# Patient Record
Sex: Male | Born: 1955 | Race: White | Hispanic: No | Smoking: Current every day smoker
Health system: Southern US, Community
[De-identification: ages and names within clinical notes are randomized; demographics above are authoritative.]

## PROBLEM LIST (undated history)

## (undated) DIAGNOSIS — G4700 Insomnia, unspecified: Secondary | ICD-10-CM

## (undated) DIAGNOSIS — K219 Gastro-esophageal reflux disease without esophagitis: Secondary | ICD-10-CM

## (undated) DIAGNOSIS — M1990 Unspecified osteoarthritis, unspecified site: Secondary | ICD-10-CM

## (undated) DIAGNOSIS — A4902 Methicillin resistant Staphylococcus aureus infection, unspecified site: Secondary | ICD-10-CM

## (undated) DIAGNOSIS — R350 Frequency of micturition: Secondary | ICD-10-CM

## (undated) DIAGNOSIS — F17200 Nicotine dependence, unspecified, uncomplicated: Secondary | ICD-10-CM

## (undated) DIAGNOSIS — J40 Bronchitis, not specified as acute or chronic: Secondary | ICD-10-CM

## (undated) HISTORY — PX: TONSILLECTOMY: SUR1361

## (undated) HISTORY — PX: COLONOSCOPY W/ POLYPECTOMY: SHX1380

---
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
## 2014-07-30 ENCOUNTER — Other Ambulatory Visit: Payer: Self-pay | Admitting: Neurosurgery

## 2014-07-30 DIAGNOSIS — M5416 Radiculopathy, lumbar region: Secondary | ICD-10-CM

## 2014-08-06 ENCOUNTER — Ambulatory Visit
Admission: RE | Admit: 2014-08-06 | Discharge: 2014-08-06 | Disposition: A | Discharge status: Home / Self Care | Payer: Medicare Other | Source: Ambulatory Visit | Attending: Neurosurgery | Admitting: Neurosurgery

## 2014-08-06 VITALS — BP 139/62 | HR 63 | Ht 71.0 in | Wt 180.0 lb

## 2014-08-06 DIAGNOSIS — M5416 Radiculopathy, lumbar region: Secondary | ICD-10-CM

## 2014-08-06 MED ORDER — MEPERIDINE HCL 100 MG/ML IJ SOLN
75.0000 mg | Freq: Once | INTRAMUSCULAR | Status: AC
  Administered 2014-08-06: 75 mg via INTRAMUSCULAR

## 2014-08-06 MED ORDER — ONDANSETRON HCL 4 MG/2ML IJ SOLN
4.0000 mg | Freq: Once | INTRAMUSCULAR | Status: AC
  Administered 2014-08-06: 4 mg via INTRAMUSCULAR

## 2014-08-06 MED ORDER — DIAZEPAM 5 MG PO TABS
10.0000 mg | ORAL_TABLET | Freq: Once | ORAL | Status: AC
  Administered 2014-08-06: 10 mg via ORAL

## 2014-08-06 MED ORDER — IOHEXOL 180 MG/ML  SOLN
20.0000 mL | Freq: Once | INTRAMUSCULAR | Status: AC | PRN
  Administered 2014-08-06: 20 mL via INTRATHECAL

## 2014-08-06 NOTE — Discharge Instructions (Signed)

## 2014-08-22 ENCOUNTER — Other Ambulatory Visit: Payer: Self-pay | Admitting: Neurosurgery

## 2014-09-05 ENCOUNTER — Encounter (HOSPITAL_COMMUNITY): Payer: Self-pay | Admitting: Pharmacy Technician

## 2014-09-12 ENCOUNTER — Other Ambulatory Visit (HOSPITAL_COMMUNITY): Payer: Medicare Other

## 2014-09-16 ENCOUNTER — Encounter (HOSPITAL_COMMUNITY)
Admission: RE | Admit: 2014-09-16 | Discharge: 2014-09-16 | Disposition: A | Discharge status: Home / Self Care | Payer: Medicare Other | Source: Ambulatory Visit | Attending: Neurosurgery | Admitting: Neurosurgery

## 2014-09-16 ENCOUNTER — Encounter (HOSPITAL_COMMUNITY): Payer: Self-pay

## 2014-09-16 HISTORY — DX: Bronchitis, not specified as acute or chronic: J40

## 2014-09-16 HISTORY — DX: Frequency of micturition: R35.0

## 2014-09-16 HISTORY — DX: Gastro-esophageal reflux disease without esophagitis: K21.9

## 2014-09-16 HISTORY — DX: Nicotine dependence, unspecified, uncomplicated: F17.200

## 2014-09-16 HISTORY — DX: Methicillin resistant Staphylococcus aureus infection, unspecified site: A49.02

## 2014-09-16 HISTORY — DX: Insomnia, unspecified: G47.00

## 2014-09-16 HISTORY — DX: Unspecified osteoarthritis, unspecified site: M19.90

## 2014-09-16 LAB — CBC
HEMATOCRIT: 44.1 % (ref 39.0–52.0)
Hemoglobin: 15.3 g/dL (ref 13.0–17.0)
MCH: 30.8 pg (ref 26.0–34.0)
MCHC: 34.7 g/dL (ref 30.0–36.0)
MCV: 88.9 fL (ref 78.0–100.0)
Platelets: 237 10*3/uL (ref 150–400)
RBC: 4.96 MIL/uL (ref 4.22–5.81)
RDW: 13.4 % (ref 11.5–15.5)
WBC: 9.6 10*3/uL (ref 4.0–10.5)

## 2014-09-16 LAB — BASIC METABOLIC PANEL
Anion gap: 13 (ref 5–15)
BUN: 9 mg/dL (ref 6–23)
CO2: 27 mEq/L (ref 19–32)
CREATININE: 0.84 mg/dL (ref 0.50–1.35)
Calcium: 9.3 mg/dL (ref 8.4–10.5)
Chloride: 99 mEq/L (ref 96–112)
Glucose, Bld: 92 mg/dL (ref 70–99)
Potassium: 4.5 mEq/L (ref 3.7–5.3)
Sodium: 139 mEq/L (ref 137–147)

## 2014-09-16 LAB — SURGICAL PCR SCREEN
MRSA, PCR: NEGATIVE
Staphylococcus aureus: NEGATIVE

## 2014-09-16 LAB — ABO/RH: ABO/RH(D): A POS

## 2014-09-16 LAB — TYPE AND SCREEN
ABO/RH(D): A POS
Antibody Screen: NEGATIVE

## 2014-09-16 NOTE — Progress Notes (Signed)
Patient denied having any cardiac or pulmonary issues. [place] Ex wife at chair side during PAT visit.

## 2014-09-16 NOTE — Pre-Procedure Instructions (Signed)
Nash DimmerLacy Almas  09/16/2014   Your procedure is scheduled on:  Friday September 19, 2014 at 9:00 AM.  [facility] at 6:00 AM.  Call this number if you have problems the morning of surgery: [phone]   Remember:   Do not eat food or drink liquids after midnight.   Take these medicines the morning of surgery with A SIP OF WATER: Alprazolam (Xanax) if needed, Bupropion (Wellbutrin), and Oxycodone (Oxycontin) if needed   Discontinue aspirin and herbal medications 5 days before surgery (Ex. Naproxen (Naprosyn)   Do not wear jewelry.  Do not wear lotions, powders, or cologne.   Men may shave face and neck.  Do not bring valuables to the hospital.  [facility] is not responsible for any belongings or valuables.               Contacts, dentures or bridgework may not be worn into surgery.  Leave suitcase in the car. After surgery it may be brought to your room.  For patients admitted to the hospital, discharge time is determined by your treatment team.               Patients discharged the day of surgery will not be allowed to drive home.  Name and phone number of your driver: Family/Friend  Special Instructions: Shower using CHG soap the night before and the morning of your surgery   Please read over the following fact sheets that you were given: Pain Booklet, Coughing and Deep Breathing, Blood Transfusion Information, MRSA Information and Surgical Site Infection Prevention

## 2014-09-17 NOTE — H&P (Signed)
[name] Graham is an 58 y.o. male.   Chief Complaint: lumbar pain with radiation to legs HPI: patient seen by me 2 months ago with a history of lumbar pain with radiation to both legswhich is getting worse, sometimes being unable to work.  Past Medical History  Diagnosis Date  . Smoker   . Bronchitis     hx of  . Frequency of urination   . Insomnia   . GERD (gastroesophageal reflux disease)   . Arthritis   . MRSA infection     right leg    Past Surgical History  Procedure Laterality Date  . Tonsillectomy    . Colonoscopy w/ polypectomy      No family history on file. Social History:  reports that he has been smoking Cigarettes.  He has a 96 pack-year smoking history. He has never used smokeless tobacco. He reports that he drinks about 3.6 - 4.8 ounces of alcohol per week. He reports that he does not use illicit drugs.  Allergies: No Known Allergies  No prescriptions prior to admission    Results for orders placed during the hospital encounter of 09/16/14 (from the past 48 hour(s))  CBC     Status: None   Collection Time    09/16/14  1:37 PM      Result Value Ref Range   WBC 9.6  4.0 - 10.5 K/uL   RBC 4.96  4.22 - 5.81 MIL/uL   Hemoglobin 15.3  13.0 - 17.0 g/dL   HCT 44.1  39.0 - 52.0 %   MCV 88.9  78.0 - 100.0 fL   MCH 30.8  26.0 - 34.0 pg   MCHC 34.7  30.0 - 36.0 g/dL   RDW 13.4  11.5 - 15.5 %   Platelets 237  150 - 400 K/uL  BASIC METABOLIC PANEL     Status: None   Collection Time    09/16/14  1:37 PM      Result Value Ref Range   Sodium 139  137 - 147 mEq/L   Potassium 4.5  3.7 - 5.3 mEq/L   Chloride 99  96 - 112 mEq/L   CO2 27  19 - 32 mEq/L   Glucose, Bld 92  70 - 99 mg/dL   BUN 9  6 - 23 mg/dL   Creatinine, Ser 0.84  0.50 - 1.35 mg/dL   Calcium 9.3  8.4 - 10.5 mg/dL   GFR calc non Af Amer >90  >90 mL/min   GFR calc Af Amer >90  >90 mL/min   Comment: (NOTE)     The eGFR has been calculated using the CKD EPI equation.     This calculation has not been  validated in all clinical situations.     eGFR's persistently <90 mL/min signify possible Chronic Kidney     Disease.   Anion gap 13  5 - 15  TYPE AND SCREEN     Status: None   Collection Time    09/16/14  1:40 PM      Result Value Ref Range   ABO/RH(D) A POS     Antibody Screen NEG     Sample Expiration 09/30/2014    SURGICAL PCR SCREEN     Status: None   Collection Time    09/16/14  1:40 PM      Result Value Ref Range   MRSA, PCR NEGATIVE  NEGATIVE   Staphylococcus aureus NEGATIVE  NEGATIVE   Comment:  The Xpert SA Assay (FDA     approved for NASAL specimens     in patients over 71 years of age),     is one component of     a comprehensive surveillance     program.  Test performance has     been validated by [employer] for patients greater     than or equal to 48 year old.     It is not intended     to diagnose infection nor to     guide or monitor treatment.  ABO/RH     Status: None   Collection Time    09/16/14  1:40 PM      Result Value Ref Range   ABO/RH(D) A POS     No results found.  Review of Systems  Constitutional: Negative.   Eyes: Negative.   Respiratory: Positive for cough.   Cardiovascular: Negative.   Gastrointestinal: Negative.   Genitourinary: Negative.   Musculoskeletal: Positive for back pain.  Skin: Negative.   Neurological: Positive for headaches.  Endo/Heme/Allergies: Negative.   Psychiatric/Behavioral: The patient is nervous/anxious.     There were no vitals taken for this visit. Physical Exam  Hent, nl. Neck, nl. Cv, nl. Lungs, clear. Abdomen , soft. Extremities, nl. NEURO slr positive at 60 degrees bilaterally. Weakness of DF both feet. Sensory nl. Lumbar myelogram shos stenosis at l4-5 with spondylolisthesis Assessment/Plan Patient to go ahead with decompression and fusion at l4-5. She is aware of risks and benefits  Yahayra Geis M 09/17/2014, 6:17 PM

## 2014-09-18 MED ORDER — CEFAZOLIN SODIUM-DEXTROSE 2-3 GM-% IV SOLR
2.0000 g | INTRAVENOUS | Status: AC
  Administered 2014-09-19: 2 g via INTRAVENOUS
  Filled 2014-09-18: qty 50

## 2014-09-19 ENCOUNTER — Inpatient Hospital Stay (HOSPITAL_COMMUNITY): Payer: Medicare Other

## 2014-09-19 ENCOUNTER — Inpatient Hospital Stay (HOSPITAL_COMMUNITY): Payer: Medicare Other | Admitting: Anesthesiology

## 2014-09-19 ENCOUNTER — Inpatient Hospital Stay (HOSPITAL_COMMUNITY)
Admission: RE | Admit: 2014-09-19 | Discharge: 2014-09-23 | DRG: 460 | Disposition: A | Discharge status: Home / Self Care | Payer: Medicare Other | Source: Ambulatory Visit | Attending: Neurosurgery | Admitting: Neurosurgery

## 2014-09-19 ENCOUNTER — Encounter (HOSPITAL_COMMUNITY): Payer: Self-pay

## 2014-09-19 ENCOUNTER — Encounter (HOSPITAL_COMMUNITY): Payer: Medicare Other | Admitting: Anesthesiology

## 2014-09-19 ENCOUNTER — Encounter (HOSPITAL_COMMUNITY)
Admission: RE | Disposition: A | Discharge status: Home / Self Care | Payer: Medicare Other | Source: Ambulatory Visit | Attending: Neurosurgery

## 2014-09-19 DIAGNOSIS — F17210 Nicotine dependence, cigarettes, uncomplicated: Secondary | ICD-10-CM | POA: Diagnosis present

## 2014-09-19 DIAGNOSIS — M545 Low back pain: Secondary | ICD-10-CM | POA: Diagnosis present

## 2014-09-19 DIAGNOSIS — M4806 Spinal stenosis, lumbar region: Secondary | ICD-10-CM | POA: Diagnosis present

## 2014-09-19 DIAGNOSIS — M4310 Spondylolisthesis, site unspecified: Secondary | ICD-10-CM

## 2014-09-19 DIAGNOSIS — M5116 Intervertebral disc disorders with radiculopathy, lumbar region: Secondary | ICD-10-CM | POA: Diagnosis present

## 2014-09-19 DIAGNOSIS — M4316 Spondylolisthesis, lumbar region: Secondary | ICD-10-CM | POA: Diagnosis present

## 2014-09-19 DIAGNOSIS — K219 Gastro-esophageal reflux disease without esophagitis: Secondary | ICD-10-CM | POA: Diagnosis present

## 2014-09-19 SURGERY — POSTERIOR LUMBAR FUSION 1 LEVEL
Anesthesia: General

## 2014-09-19 MED ORDER — DIAZEPAM 5 MG PO TABS
5.0000 mg | ORAL_TABLET | Freq: Four times a day (QID) | ORAL | Status: DC | PRN
  Administered 2014-09-21 – 2014-09-23 (×6): 5 mg via ORAL
  Filled 2014-09-19 (×6): qty 1

## 2014-09-19 MED ORDER — MIDAZOLAM HCL 2 MG/2ML IJ SOLN
INTRAMUSCULAR | Status: AC
  Filled 2014-09-19: qty 2

## 2014-09-19 MED ORDER — FENTANYL CITRATE 0.05 MG/ML IJ SOLN
INTRAMUSCULAR | Status: AC
  Filled 2014-09-19: qty 5

## 2014-09-19 MED ORDER — FENTANYL 10 MCG/ML IV SOLN
INTRAVENOUS | Status: DC
  Administered 2014-09-19: 130 ug via INTRAVENOUS
  Administered 2014-09-19: 13:00:00 via INTRAVENOUS
  Administered 2014-09-20: 75 ug via INTRAVENOUS
  Administered 2014-09-20: 60 ug via INTRAVENOUS
  Filled 2014-09-19: qty 50

## 2014-09-19 MED ORDER — ONDANSETRON HCL 4 MG/2ML IJ SOLN: 4.0000 mg | Freq: Four times a day (QID) | INTRAMUSCULAR | Status: DC | PRN

## 2014-09-19 MED ORDER — PROPOFOL 10 MG/ML IV BOLUS
INTRAVENOUS | Status: AC
  Filled 2014-09-19: qty 20

## 2014-09-19 MED ORDER — MENTHOL 3 MG MT LOZG: 1.0000 | LOZENGE | OROMUCOSAL | Status: DC | PRN

## 2014-09-19 MED ORDER — LIDOCAINE HCL (CARDIAC) 20 MG/ML IV SOLN
INTRAVENOUS | Status: AC
  Filled 2014-09-19: qty 5

## 2014-09-19 MED ORDER — VANCOMYCIN HCL 1000 MG IV SOLR
INTRAVENOUS | Status: DC | PRN
  Administered 2014-09-19: 1000 mg

## 2014-09-19 MED ORDER — NEOSTIGMINE METHYLSULFATE 10 MG/10ML IV SOLN
INTRAVENOUS | Status: AC
  Filled 2014-09-19: qty 1

## 2014-09-19 MED ORDER — FENTANYL CITRATE 0.05 MG/ML IJ SOLN
INTRAMUSCULAR | Status: DC | PRN
  Administered 2014-09-19 (×2): 100 ug via INTRAVENOUS
  Administered 2014-09-19: 50 ug via INTRAVENOUS
  Administered 2014-09-19 (×2): 100 ug via INTRAVENOUS
  Administered 2014-09-19: 50 ug via INTRAVENOUS
  Administered 2014-09-19: 150 ug via INTRAVENOUS
  Administered 2014-09-19: 100 ug via INTRAVENOUS

## 2014-09-19 MED ORDER — OXYCODONE HCL 5 MG/5ML PO SOLN
5.0000 mg | Freq: Once | ORAL | Status: DC | PRN
Start: 2014-09-19 — End: 2014-09-19

## 2014-09-19 MED ORDER — THROMBIN 20000 UNITS EX SOLR
CUTANEOUS | Status: DC | PRN
  Administered 2014-09-19: 10:00:00 via TOPICAL

## 2014-09-19 MED ORDER — SUCCINYLCHOLINE CHLORIDE 20 MG/ML IJ SOLN
INTRAMUSCULAR | Status: AC
  Filled 2014-09-19: qty 1

## 2014-09-19 MED ORDER — ALBUMIN HUMAN 5 % IV SOLN
INTRAVENOUS | Status: DC | PRN
  Administered 2014-09-19: 11:00:00 via INTRAVENOUS

## 2014-09-19 MED ORDER — NEOSTIGMINE METHYLSULFATE 10 MG/10ML IV SOLN
INTRAVENOUS | Status: DC | PRN
  Administered 2014-09-19: 3 mg via INTRAVENOUS

## 2014-09-19 MED ORDER — HYDROMORPHONE HCL 1 MG/ML IJ SOLN
0.2500 mg | INTRAMUSCULAR | Status: DC | PRN
  Administered 2014-09-19 (×4): 0.5 mg via INTRAVENOUS

## 2014-09-19 MED ORDER — DIPHENHYDRAMINE HCL 50 MG/ML IJ SOLN: 12.5000 mg | Freq: Four times a day (QID) | INTRAMUSCULAR | Status: DC | PRN

## 2014-09-19 MED ORDER — SODIUM CHLORIDE 0.9 % IJ SOLN: 3.0000 mL | INTRAMUSCULAR | Status: DC | PRN

## 2014-09-19 MED ORDER — BUPROPION HCL ER (XL) 150 MG PO TB24
150.0000 mg | ORAL_TABLET | Freq: Every day | ORAL | Status: DC
  Administered 2014-09-19 – 2014-09-23 (×5): 150 mg via ORAL
  Filled 2014-09-19 (×5): qty 1

## 2014-09-19 MED ORDER — PHENYLEPHRINE 40 MCG/ML (10ML) SYRINGE FOR IV PUSH (FOR BLOOD PRESSURE SUPPORT)
PREFILLED_SYRINGE | INTRAVENOUS | Status: AC
  Filled 2014-09-19: qty 10

## 2014-09-19 MED ORDER — ROCURONIUM BROMIDE 50 MG/5ML IV SOLN
INTRAVENOUS | Status: AC
  Filled 2014-09-19: qty 1

## 2014-09-19 MED ORDER — EPHEDRINE SULFATE 50 MG/ML IJ SOLN
INTRAMUSCULAR | Status: AC
  Filled 2014-09-19: qty 1

## 2014-09-19 MED ORDER — SODIUM CHLORIDE 0.9 % IJ SOLN
3.0000 mL | Freq: Two times a day (BID) | INTRAMUSCULAR | Status: DC
  Administered 2014-09-20 – 2014-09-22 (×5): 3 mL via INTRAVENOUS

## 2014-09-19 MED ORDER — CEFAZOLIN SODIUM 1-5 GM-% IV SOLN
1.0000 g | Freq: Three times a day (TID) | INTRAVENOUS | Status: AC
  Administered 2014-09-19 – 2014-09-20 (×2): 1 g via INTRAVENOUS
  Filled 2014-09-19 (×2): qty 50

## 2014-09-19 MED ORDER — ACETAMINOPHEN 650 MG RE SUPP: 650.0000 mg | RECTAL | Status: DC | PRN

## 2014-09-19 MED ORDER — LACTATED RINGERS IV SOLN
INTRAVENOUS | Status: DC | PRN
  Administered 2014-09-19 (×4): via INTRAVENOUS

## 2014-09-19 MED ORDER — HYDROMORPHONE HCL 1 MG/ML IJ SOLN
INTRAMUSCULAR | Status: AC
  Filled 2014-09-19: qty 1

## 2014-09-19 MED ORDER — SODIUM CHLORIDE 0.9 % IV SOLN: 250.0000 mL | INTRAVENOUS | Status: DC

## 2014-09-19 MED ORDER — DEXAMETHASONE SODIUM PHOSPHATE 10 MG/ML IJ SOLN
INTRAMUSCULAR | Status: AC
Start: 2014-09-19 — End: 2014-09-19
  Filled 2014-09-19: qty 1

## 2014-09-19 MED ORDER — DEXAMETHASONE SODIUM PHOSPHATE 10 MG/ML IJ SOLN
INTRAMUSCULAR | Status: DC | PRN
  Administered 2014-09-19: 10 mg via INTRAVENOUS

## 2014-09-19 MED ORDER — PROPOFOL 10 MG/ML IV BOLUS
INTRAVENOUS | Status: DC | PRN
  Administered 2014-09-19: 200 mg via INTRAVENOUS

## 2014-09-19 MED ORDER — GLYCOPYRROLATE 0.2 MG/ML IJ SOLN
INTRAMUSCULAR | Status: DC | PRN
  Administered 2014-09-19: 0.4 mg via INTRAVENOUS

## 2014-09-19 MED ORDER — HYDROMORPHONE HCL 1 MG/ML IJ SOLN
INTRAMUSCULAR | Status: AC
Start: 2014-09-19 — End: 2014-09-19
  Filled 2014-09-19: qty 1

## 2014-09-19 MED ORDER — SODIUM CHLORIDE 0.9 % IJ SOLN
INTRAMUSCULAR | Status: AC
  Filled 2014-09-19: qty 10

## 2014-09-19 MED ORDER — BUPIVACAINE LIPOSOME 1.3 % IJ SUSP
20.0000 mL | Freq: Once | INTRAMUSCULAR | Status: DC
  Filled 2014-09-19: qty 20

## 2014-09-19 MED ORDER — LACTATED RINGERS IV SOLN
INTRAVENOUS | Status: DC
  Administered 2014-09-19: 07:00:00 via INTRAVENOUS

## 2014-09-19 MED ORDER — OXYCODONE-ACETAMINOPHEN 5-325 MG PO TABS
1.0000 | ORAL_TABLET | ORAL | Status: DC | PRN
  Administered 2014-09-20 (×2): 2 via ORAL
  Filled 2014-09-19 (×2): qty 2

## 2014-09-19 MED ORDER — VANCOMYCIN HCL 1000 MG IV SOLR
INTRAVENOUS | Status: AC
  Filled 2014-09-19: qty 1000

## 2014-09-19 MED ORDER — ONDANSETRON HCL 4 MG/2ML IJ SOLN
INTRAMUSCULAR | Status: DC | PRN
Start: 2014-09-19 — End: 2014-09-19
  Administered 2014-09-19: 4 mg via INTRAVENOUS

## 2014-09-19 MED ORDER — 0.9 % SODIUM CHLORIDE (POUR BTL) OPTIME
TOPICAL | Status: DC | PRN
  Administered 2014-09-19: 1000 mL

## 2014-09-19 MED ORDER — GLYCOPYRROLATE 0.2 MG/ML IJ SOLN
INTRAMUSCULAR | Status: AC
  Filled 2014-09-19: qty 2

## 2014-09-19 MED ORDER — BACITRACIN ZINC 500 UNIT/GM EX OINT
TOPICAL_OINTMENT | CUTANEOUS | Status: DC | PRN
  Administered 2014-09-19: 1 via TOPICAL

## 2014-09-19 MED ORDER — ZOLPIDEM TARTRATE 5 MG PO TABS
10.0000 mg | ORAL_TABLET | Freq: Every evening | ORAL | Status: DC | PRN
  Administered 2014-09-20 – 2014-09-22 (×3): 10 mg via ORAL
  Filled 2014-09-19 (×3): qty 2

## 2014-09-19 MED ORDER — MIDAZOLAM HCL 5 MG/5ML IJ SOLN
INTRAMUSCULAR | Status: DC | PRN
  Administered 2014-09-19: 2 mg via INTRAVENOUS

## 2014-09-19 MED ORDER — NICOTINE 14 MG/24HR TD PT24
14.0000 mg | MEDICATED_PATCH | Freq: Every day | TRANSDERMAL | Status: DC
  Administered 2014-09-20 – 2014-09-23 (×4): 14 mg via TRANSDERMAL
  Filled 2014-09-19 (×4): qty 1

## 2014-09-19 MED ORDER — ONDANSETRON HCL 4 MG/2ML IJ SOLN: 4.0000 mg | INTRAMUSCULAR | Status: DC | PRN

## 2014-09-19 MED ORDER — NALOXONE HCL 0.4 MG/ML IJ SOLN: 0.4000 mg | INTRAMUSCULAR | Status: DC | PRN

## 2014-09-19 MED ORDER — PROMETHAZINE HCL 25 MG/ML IJ SOLN: 6.2500 mg | INTRAMUSCULAR | Status: DC | PRN

## 2014-09-19 MED ORDER — LIDOCAINE HCL (CARDIAC) 20 MG/ML IV SOLN
INTRAVENOUS | Status: DC | PRN
  Administered 2014-09-19: 100 mg via INTRAVENOUS

## 2014-09-19 MED ORDER — BUPIVACAINE LIPOSOME 1.3 % IJ SUSP
INTRAMUSCULAR | Status: DC | PRN
  Administered 2014-09-19: 20 mL

## 2014-09-19 MED ORDER — ONDANSETRON HCL 4 MG/2ML IJ SOLN
INTRAMUSCULAR | Status: AC
  Filled 2014-09-19: qty 2

## 2014-09-19 MED ORDER — ROCURONIUM BROMIDE 100 MG/10ML IV SOLN
INTRAVENOUS | Status: DC | PRN
  Administered 2014-09-19: 10 mg via INTRAVENOUS
  Administered 2014-09-19: 50 mg via INTRAVENOUS
  Administered 2014-09-19: 20 mg via INTRAVENOUS
  Administered 2014-09-19: 10 mg via INTRAVENOUS

## 2014-09-19 MED ORDER — ACETAMINOPHEN 325 MG PO TABS: 650.0000 mg | ORAL_TABLET | ORAL | Status: DC | PRN

## 2014-09-19 MED ORDER — PHENOL 1.4 % MT LIQD: 1.0000 | OROMUCOSAL | Status: DC | PRN

## 2014-09-19 MED ORDER — SODIUM CHLORIDE 0.9 % IV SOLN
INTRAVENOUS | Status: DC
  Administered 2014-09-19 – 2014-09-20 (×2): via INTRAVENOUS

## 2014-09-19 MED ORDER — DIPHENHYDRAMINE HCL 12.5 MG/5ML PO ELIX: 12.5000 mg | ORAL_SOLUTION | Freq: Four times a day (QID) | ORAL | Status: DC | PRN

## 2014-09-19 MED ORDER — SODIUM CHLORIDE 0.9 % IJ SOLN: 9.0000 mL | INTRAMUSCULAR | Status: DC | PRN

## 2014-09-19 MED ORDER — OXYCODONE HCL 5 MG PO TABS: 5.0000 mg | ORAL_TABLET | Freq: Once | ORAL | Status: DC | PRN

## 2014-09-19 SURGICAL SUPPLY — 68 items
BENZOIN TINCTURE PRP APPL 2/3 (GAUZE/BANDAGES/DRESSINGS) ×3 IMPLANT
BLADE CLIPPER SURG (BLADE) IMPLANT
BUR ACORN 6.0 (BURR) ×4 IMPLANT
BUR ACORN 6.0MM (BURR) ×2
BUR MATCHSTICK NEURO 3.0 LAGG (BURR) ×3 IMPLANT
CANISTER SUCT 3000ML (MISCELLANEOUS) ×3 IMPLANT
CAP LOCKING THREADED (Cap) ×12 IMPLANT
CLOSURE WOUND 1/2 X4 (GAUZE/BANDAGES/DRESSINGS) ×1
CONT SPEC 4OZ CLIKSEAL STRL BL (MISCELLANEOUS) ×6 IMPLANT
COVER BACK TABLE 60X90IN (DRAPES) ×3 IMPLANT
CROSSLINK SPINAL 38-50MM (Neuro Prosthesis/Implant) ×3 IMPLANT
DRAPE C-ARM 42X72 X-RAY (DRAPES) ×6 IMPLANT
DRAPE LAPAROTOMY 100X72X124 (DRAPES) ×3 IMPLANT
DRAPE POUCH INSTRU U-SHP 10X18 (DRAPES) ×3 IMPLANT
DRSG OPSITE POSTOP 4X6 (GAUZE/BANDAGES/DRESSINGS) ×3 IMPLANT
DRSG PAD ABDOMINAL 8X10 ST (GAUZE/BANDAGES/DRESSINGS) IMPLANT
DURAPREP 26ML APPLICATOR (WOUND CARE) ×3 IMPLANT
DURASEAL APPLICATOR TIP (TIP) ×3 IMPLANT
DURASEAL SPINE SEALANT 3ML (MISCELLANEOUS) ×3 IMPLANT
ELECT BLADE 4.0 EZ CLEAN MEGAD (MISCELLANEOUS) ×3
ELECT REM PT RETURN 9FT ADLT (ELECTROSURGICAL) ×3
ELECTRODE BLDE 4.0 EZ CLN MEGD (MISCELLANEOUS) ×1 IMPLANT
ELECTRODE REM PT RTRN 9FT ADLT (ELECTROSURGICAL) ×1 IMPLANT
EVACUATOR 1/8 PVC DRAIN (DRAIN) ×3 IMPLANT
GAUZE SPONGE 4X4 12PLY STRL (GAUZE/BANDAGES/DRESSINGS) ×3 IMPLANT
GAUZE SPONGE 4X4 16PLY XRAY LF (GAUZE/BANDAGES/DRESSINGS) ×3 IMPLANT
GLOVE BIOGEL M 8.0 STRL (GLOVE) ×6 IMPLANT
GLOVE EXAM NITRILE LRG STRL (GLOVE) IMPLANT
GLOVE EXAM NITRILE MD LF STRL (GLOVE) IMPLANT
GLOVE EXAM NITRILE XL STR (GLOVE) IMPLANT
GLOVE EXAM NITRILE XS STR PU (GLOVE) IMPLANT
GOWN STRL REUS W/ TWL LRG LVL3 (GOWN DISPOSABLE) ×2 IMPLANT
GOWN STRL REUS W/ TWL XL LVL3 (GOWN DISPOSABLE) ×1 IMPLANT
GOWN STRL REUS W/TWL 2XL LVL3 (GOWN DISPOSABLE) IMPLANT
GOWN STRL REUS W/TWL LRG LVL3 (GOWN DISPOSABLE) ×4
GOWN STRL REUS W/TWL XL LVL3 (GOWN DISPOSABLE) ×2
KIT BASIN OR (CUSTOM PROCEDURE TRAY) ×3 IMPLANT
KIT INFUSE MEDIUM (Orthopedic Implant) ×3 IMPLANT
KIT ROOM TURNOVER OR (KITS) ×3 IMPLANT
MILL MEDIUM DISP (BLADE) ×3 IMPLANT
NEEDLE HYPO 18GX1.5 BLUNT FILL (NEEDLE) IMPLANT
NEEDLE HYPO 21X1.5 SAFETY (NEEDLE) ×3 IMPLANT
NEEDLE HYPO 25X1 1.5 SAFETY (NEEDLE) IMPLANT
NS IRRIG 1000ML POUR BTL (IV SOLUTION) ×3 IMPLANT
PACK LAMINECTOMY NEURO (CUSTOM PROCEDURE TRAY) ×3 IMPLANT
PAD ARMBOARD 7.5X6 YLW CONV (MISCELLANEOUS) ×9 IMPLANT
PATTIES SURGICAL .5 X1 (DISPOSABLE) ×3 IMPLANT
PATTIES SURGICAL .5 X3 (DISPOSABLE) IMPLANT
ROD CREO 45MM SPINAL (Rod) ×6 IMPLANT
SCREW CREO THREADED 5.5X45MM (Screw) ×12 IMPLANT
SPACER RISE 8X22 11-17MM-15 (Spacer) ×6 IMPLANT
SPONGE LAP 4X18 X RAY DECT (DISPOSABLE) IMPLANT
SPONGE NEURO XRAY DETECT 1X3 (DISPOSABLE) IMPLANT
SPONGE SURGIFOAM ABS GEL 100 (HEMOSTASIS) ×3 IMPLANT
STAPLER VISISTAT 35W (STAPLE) ×3 IMPLANT
STRIP CLOSURE SKIN 1/2X4 (GAUZE/BANDAGES/DRESSINGS) ×2 IMPLANT
SUT PROLENE 5 0 C1 (SUTURE) ×3 IMPLANT
SUT VIC AB 1 CT1 18XBRD ANBCTR (SUTURE) ×2 IMPLANT
SUT VIC AB 1 CT1 8-18 (SUTURE) ×4
SUT VIC AB 2-0 CP2 18 (SUTURE) ×3 IMPLANT
SUT VIC AB 3-0 SH 8-18 (SUTURE) ×6 IMPLANT
SYR 20CC LL (SYRINGE) ×3 IMPLANT
SYR 20ML ECCENTRIC (SYRINGE) ×3 IMPLANT
SYR 5ML LL (SYRINGE) IMPLANT
TOWEL OR 17X24 6PK STRL BLUE (TOWEL DISPOSABLE) ×3 IMPLANT
TOWEL OR 17X26 10 PK STRL BLUE (TOWEL DISPOSABLE) ×3 IMPLANT
TRAY FOLEY CATH 14FRSI W/METER (CATHETERS) ×3 IMPLANT
WATER STERILE IRR 1000ML POUR (IV SOLUTION) ×3 IMPLANT

## 2014-09-19 NOTE — Anesthesia Postprocedure Evaluation (Signed)
Anesthesia Post Note  Patient: [name] Graham  Procedure(s) Performed: Procedure(s) (LRB): POSTERIOR LUMBAR INTERBODY FUSION LUMBAR FOUR-FIVE (N/A)  Anesthesia type: general  Patient location: PACU  Post pain: Pain level controlled  Post assessment: Patient's Cardiovascular Status Stable  Last Vitals:  Filed Vitals:   09/19/14 1323  BP:   Pulse: 90  Temp:   Resp: 11    Post vital signs: Reviewed and stable  Level of consciousness: sedated  Complications: No apparent anesthesia complications

## 2014-09-19 NOTE — Anesthesia Preprocedure Evaluation (Signed)
Anesthesia Evaluation    Reviewed: Allergy & Precautions, H&P , NPO status   History of Anesthesia Complications Negative for: history of anesthetic complications  Airway        Dental   Pulmonary Current Smoker,          Cardiovascular negative cardio ROS      Neuro/Psych negative psych ROS   GI/Hepatic Neg liver ROS, GERD-  ,  Endo/Other    Renal/GU      Musculoskeletal   Abdominal   Peds  Hematology   Anesthesia Other Findings   Reproductive/Obstetrics                             Anesthesia Physical Anesthesia Plan  ASA: II  Anesthesia Plan: General   Post-op Pain Management:    Induction: Intravenous  Airway Management Planned: Oral ETT  Additional Equipment:   Intra-op Plan:   Post-operative Plan: Extubation in OR  Informed Consent:   Plan Discussed with: CRNA, Anesthesiologist and Surgeon  Anesthesia Plan Comments:         Anesthesia Quick Evaluation

## 2014-09-19 NOTE — Op Note (Signed)
[name] Graham:  Graham, [name]                   ACCOUNT NO.:  [id number]  MEDICAL RECORD NO.:  [id number]  LOCATION:  [id number]                        FACILITY:  [facility]  PHYSICIAN:  [name], M.D.   DATE OF BIRTH:  04-21-1956  DATE OF PROCEDURE:  09/19/2014 DATE OF DISCHARGE:                              OPERATIVE REPORT   PREOPERATIVE DIAGNOSIS:  L4-L5 stenosis with spondylolisthesis.  Chronic lumbar radiculopathy.  POSTOPERATIVE DIAGNOSIS:  L4-L5 stenosis with spondylolisthesis. Chronic lumbar radiculopathy.  PROCEDURE:  L4 Gill procedure which involved removal of spinous process, lamina, and facet.  Bilaterally L4-5 diskectomy beyond the normal to be able to introduce 2 cages, right and left.  Pedicle screws at L4, L5, posterolateral arthrodesis with autograft and BMP.  SURGEON:  Hilda LiasErnesto Zavier Canela, M.D.  ASSISTING:  Donalee CitrinGary Cram, M.D.  CLINICAL HISTORY:  The patient was seen in my office on several occasions, complaining of back pain radiating to both legs.  X-ray showed multiple level degenerative disk disease, but at the level of L4- 5, he has a stenosis secondary to spondylolisthesis.  Surgery was advised, and the patient knew the risk with the surgery including possibility of infection, hematoma, seroma, need of further surgery, CSF leak.  DESCRIPTION OF PROCEDURE:  The patient was taken to the OR, and after intubation, he was positioned in a prone manner.  The back was cleaned with Betadine and later on with DuraPrep.  Drapes were applied.  Midline incision from upper L4 down to the level of L5 was made, all the way down through the fascia.  Muscle and fascia were retracted all the way laterally.  The x-ray showed indeed we were right at the level of 4-5. From then on, we started the [facility] of removing the spinous process of L4, the lamina.  The patient has quite a bit of hypertrophy of the facet.  With a drill, we were able to remove L4 facet.  The patient has quite a bit of  thickening of the yellow ligament with accession bilaterally, worse on the right side.  Microdissection was carried out with decompression of the thecal sac as well as the L4 and L5 nerve root was done.  There was a small adhesion at the level of L4 in the right side with arachnoid pouch.  It was oversewn with a 5-0 Prolene.  From then on, we entered the disk space first in the right side and then in the left side.  Incision was made.  Total gross diskectomy was achieved medially and laterally beyond the normal.  The endplates were drilled.  Then, a cage from 83 mm height was expanded all the way down to 17.  Both cages had autograft and BMP.  Lateral lumbar spine showed good position of the cages.  Then using the C-arm first in AP view and then a lateral view, we were able to feel the pedicle of L4 and L5.  Holes were made.  Prior to introduction of the screws, we were able to probe the all 4 holes just to be sure that we were surrounded by bone.  From then on, 4 screws of 5.5 x 45 mm were inserted, kept in  place with a rod and capps.  From then on, we went laterally, the L4-5, the lateral aspect of the facet and transverse process were removed and a mix of BMP and autograft was used for arthrodesis.  The area was irrigated.  Valsalva maneuver up to 50 twice was negative.  From then on, the area was irrigated.  Drain was left in the epidural space, and the wound was closed with Vicryl and staples.          ______________________________ [name], M.D.     EB/MEDQ  D:  09/19/2014  T:  09/19/2014  Job:  [id number]

## 2014-09-19 NOTE — Progress Notes (Signed)
Utilization review completed.  

## 2014-09-19 NOTE — Progress Notes (Signed)
Patient ID: [name] Graham, male   DOB: 06/16/1956, 58 y.o.   MRN: [id number] Feeling better. No leg pain as preop. Wants nicoderm patch. hemovac working

## 2014-09-19 NOTE — Transfer of Care (Signed)
Immediate Anesthesia Transfer of Care Note  Patient: [name] Graham  Procedure(s) Performed: Procedure(s): POSTERIOR LUMBAR INTERBODY FUSION LUMBAR FOUR-FIVE (N/A)  Patient Location: PACU  Anesthesia Type:General  Level of Consciousness: awake, alert  and patient cooperative  Airway & Oxygen Therapy: Patient Spontanous Breathing and Patient connected to nasal cannula oxygen  Post-op Assessment: Report given to PACU RN, Post -op Vital signs reviewed and stable and Patient moving all extremities  Post vital signs: Reviewed and stable  Complications: No apparent anesthesia complications

## 2014-09-20 ENCOUNTER — Encounter (HOSPITAL_COMMUNITY): Payer: Self-pay

## 2014-09-20 MED ORDER — MORPHINE SULFATE 4 MG/ML IJ SOLN
4.0000 mg | INTRAMUSCULAR | Status: DC | PRN
  Administered 2014-09-20 – 2014-09-21 (×3): 4 mg via INTRAMUSCULAR
  Filled 2014-09-20 (×4): qty 1

## 2014-09-20 MED ORDER — OXYCODONE-ACETAMINOPHEN 5-325 MG PO TABS
1.0000 | ORAL_TABLET | ORAL | Status: DC | PRN
  Administered 2014-09-20 – 2014-09-23 (×15): 2 via ORAL
  Filled 2014-09-20 (×16): qty 2

## 2014-09-20 NOTE — Progress Notes (Signed)
PCA fentanyl 220 mcg wasted, witnessed by Jaci CarrelEunice Ansomaa RN.

## 2014-09-20 NOTE — Progress Notes (Signed)
Called ortho tec to notify that patient was ordered a brace.

## 2014-09-20 NOTE — Progress Notes (Signed)
Occupational Therapy Evaluation and Discharge Patient Details Name: [name] Graham MRN: [id number] DOB: 07/15/1956 Today's Date: 09/20/2014    History of Present Illness Patient is a 58 y/o male s/p PLIF L4-5.   Clinical Impression   PTA pt lived at home and was independent with ADLs and IADLs. Pt currently requires Supervision for ADLs and functional mobility. Pt progressing well with mobility and will have 24/7 assistance initially. No further acute OT needs.     Follow Up Recommendations  No OT follow up    Equipment Recommendations  None recommended by OT    Recommendations for Other Services       Precautions / Restrictions Precautions Precautions: Back Precaution Booklet Issued: Yes (comment) Precaution Comments: Educated on 3/3 back precautions and incorporating into ADLs. Pt able to recall 3/3 precautions. Required Braces or Orthoses: Spinal Brace Spinal Brace: Lumbar corset;Applied in sitting position Restrictions Weight Bearing Restrictions: No      Mobility Bed Mobility Overal bed mobility: Needs Assistance Bed Mobility: Rolling;Sidelying to Sit;Sit to Sidelying Rolling: Supervision Sidelying to sit: Min guard;HOB elevated     Sit to sidelying: Min guard;HOB elevated General bed mobility comments: Pt sitting in recliner when OT arrived. Pt verbalized log roll technique and per PT note, required Min Guard/Supervision for bed mobility.   Transfers Overall transfer level: Needs assistance Equipment used: None Transfers: Sit to/from Stand Sit to Stand: Supervision         General transfer comment: Supervision for safety.    Balance Overall balance assessment: Needs assistance   Sitting balance-Leahy Scale: Good     Standing balance support: During functional activity Standing balance-Leahy Scale: Fair                              ADL Overall ADL's : Needs assistance/impaired                                        General ADL Comments: Pt overall requires Supervision for ADLs and functional mobility. Pt ambulating well and eager to get home. Educated pt on incorporating back precautions into ADLs including keeping items on counter level, using a cup for brushing teeth, tongs for toilet hygiene, etc.      Vision  Pt wears glasses at all times and reports no change from baseline.   No apparent deficits.                  Perception Perception Perception Tested?: No   Praxis Praxis Praxis tested?: Within functional limits    Pertinent Vitals/Pain Pain Assessment: 0-10 Pain Score: 5  Pain Location: back Pain Descriptors / Indicators: Sore Pain Intervention(s): Monitored during session;Repositioned     Hand Dominance     Extremity/Trunk Assessment Upper Extremity Assessment Upper Extremity Assessment: Overall WFL for tasks assessed   Lower Extremity Assessment Lower Extremity Assessment: Overall WFL for tasks assessed   Cervical / Trunk Assessment Cervical / Trunk Assessment: Normal   Communication Communication Communication: No difficulties   Cognition Arousal/Alertness: Awake/alert Behavior During Therapy: WFL for tasks assessed/performed Overall Cognitive Status: Within Functional Limits for tasks assessed                                Home Living Family/patient expects to be discharged to:: Private residence Living  Arrangements: Spouse/significant other Available Help at Discharge: Family;Available 24 hours/day (Ex-wife to stay with pt for a short time) Type of Home: House Home Access: Stairs to enter [employer] CorporationEntrance Stairs-Number of Steps: 2 Entrance Stairs-Rails: Right;Left;Can reach both Home Layout: One level     Bathroom Shower/Tub: Tub/shower unit Shower/tub characteristics: Engineer, building servicesCurtain Bathroom Toilet: Standard     Home Equipment: Shower seat          Prior Functioning/Environment Level of Independence: Independent        Comments: very active  PTA, works as Education administratorpainter. Drives.    OT Diagnosis: Generalized weakness;Acute pain                          End of Session Equipment Utilized During Treatment: Gait belt;Back brace  Activity Tolerance: Patient tolerated treatment well Patient left: in chair;with call bell/phone within reach   Time: 1559-1610 OT Time Calculation (min): 11 min Charges:  OT General Charges $OT Visit: 1 Procedure OT Evaluation $Initial OT Evaluation Tier I: 1 Procedure  Nena JordanMiller, Gerline Ratto M 09/20/2014, 4:11 PM  Carney LivingLeeAnn Marie Johnhenry Tippin, OTR/L Occupational Therapist [phone] (pager)

## 2014-09-20 NOTE — Progress Notes (Signed)
Orthopedic Tech Progress Note Patient Details:  Gean QuintLacy J Taubman 07/03/1956 [id number] Called in order to [employer]. Patient ID: Gean QuintLacy J Mayfield, male   DOB: 08/30/1956, 58 y.o.   MRN: [id number]   Lesle ChrisGilliland, Jemaine Prokop L 09/20/2014, 12:41 PM

## 2014-09-20 NOTE — Progress Notes (Signed)
Patient pulled hemo vac on accident. Will continue to monitor.

## 2014-09-20 NOTE — Progress Notes (Signed)
Filed Vitals:   09/20/14 0200 09/20/14 0400 09/20/14 0600 09/20/14 0828  BP: 104/52  117/55   Pulse: 66  67   Temp: 98.6 F (37 C)  97.5 F (36.4 C)   TempSrc: Oral  Oral   Resp: 18 21 20 22   Height:      Weight:      SpO2: 99% 99% 98% 97%    Patient resting in bed, fairly comfortable. Has not yet been out of bed. Has a fentanyl PCA, but not using it much. We'll DC fentanyl PCA and order Percocet by mouth or morphine IM for pain. This will allow patient to mobilize. Foley to see this morning, and patient has voided. Dressing with a small amount of bloody drainage.  Plan: Encouraged to ambulate at least 3-4 times today in the hall, and 4-5 times in the halls tomorrow. Awaiting PT and OT.  Hewitt ShortsNUDELMAN,ROBERT W, MD 09/20/2014, 9:45 AM

## 2014-09-20 NOTE — Evaluation (Signed)
Physical Therapy Evaluation Patient Details Name: [name] Graham MRN: [id number] DOB: 11/09/1956 Today's Date: 09/20/2014   History of Present Illness  Patient is a 58 y/o male s/p PLIF L4-5.  Clinical Impression  Patient presents with functional limitations due to deficits listed in PT problem list (see below). Pt with pain and mild balance deficits impacting safe mobility. Anticipate balance and mobility will improve with continued ambulation. Recommend daily ambulation in hall with family/RN. Education provided on back precautions. Able to recall 3/3 precautions within session. Pt would benefit from acute PT to improve transfers, balance and overall safe mobility so pt can maximize independence and return to PLOF.   Follow Up Recommendations Supervision - Intermittent;No PT follow up    Equipment Recommendations  None recommended by PT    Recommendations for Other Services       Precautions / Restrictions Precautions Precautions: Back Precaution Comments: Educated on back precautions. Required Braces or Orthoses: Spinal Brace Spinal Brace: Lumbar corset;Applied in sitting position Restrictions Weight Bearing Restrictions: No      Mobility  Bed Mobility Overal bed mobility: Needs Assistance Bed Mobility: Rolling;Sidelying to Sit;Sit to Sidelying Rolling: Supervision Sidelying to sit: Min guard;HOB elevated     Sit to sidelying: Min guard;HOB elevated General bed mobility comments: Use of rails for support. VC's for log roll technique.  Transfers Overall transfer level: Needs assistance Equipment used: None Transfers: Sit to/from Stand Sit to Stand: Supervision         General transfer comment: Supervision for safety.  Ambulation/Gait Ambulation/Gait assistance: Min guard Ambulation Distance (Feet): 200 Feet Assistive device: None Gait Pattern/deviations: Step-through pattern;Decreased stride length;Drifts right/left   Gait velocity interpretation: Below normal  speed for age/gender General Gait Details: Pt with mild unsteady gait with drifting noted occasionally especially with head turns but no overt LOB.  Stairs Stairs: Yes Stairs assistance: Min guard Stair Management: Step to pattern;One rail Right Number of Stairs: 2 General stair comments: Min guard for safety. Cues for technique.  Wheelchair Mobility    Modified Rankin (Stroke Patients Only)       Balance Overall balance assessment: Needs assistance   Sitting balance-Leahy Scale: Good     Standing balance support: During functional activity Standing balance-Leahy Scale: Fair                               Pertinent Vitals/Pain Pain Assessment: 0-10 Pain Score: 5  Pain Location: back Pain Descriptors / Indicators: Sore;Aching Pain Intervention(s): Monitored during session;Repositioned    Home Living Family/patient expects to be discharged to:: Private residence Living Arrangements: Spouse/significant other Available Help at Discharge: Family;Available 24 hours/day (Ex-wife to stay with patient for a short while.) Type of Home: House Home Access: Stairs to enter Entrance Stairs-Rails: Right;Left;Can reach both Entrance Stairs-Number of Steps: 2 Home Layout: One level Home Equipment: None      Prior Function Level of Independence: Independent         Comments: very active PTA, works as Education administratorpainter. Drives.     Hand Dominance        Extremity/Trunk Assessment   Upper Extremity Assessment: Defer to OT evaluation           Lower Extremity Assessment: Overall WFL for tasks assessed         Communication   Communication: No difficulties  Cognition Arousal/Alertness: Awake/alert Behavior During Therapy: WFL for tasks assessed/performed Overall Cognitive Status: Within Functional Limits for tasks assessed  General Comments General comments (skin integrity, edema, etc.): Instructed pt on when/how to donn lumbar  corset.    Exercises        Assessment/Plan    PT Assessment Patient needs continued PT services  PT Diagnosis Acute pain;Difficulty walking   PT Problem List Pain;Decreased activity tolerance;Decreased safety awareness;Decreased balance;Decreased knowledge of precautions;Decreased mobility  PT Treatment Interventions Balance training;Gait training;Stair training;Patient/family education;Functional mobility training;Therapeutic activities;Therapeutic exercise   PT Goals (Current goals can be found in the Care Plan section) Acute Rehab PT Goals Patient Stated Goal: to get home PT Goal Formulation: With patient Time For Goal Achievement: 10/04/14 Potential to Achieve Goals: Good    Frequency Min 5X/week   Barriers to discharge   Pt has ex wife staying with him at d/c.    Co-evaluation               End of Session Equipment Utilized During Treatment: Gait belt Activity Tolerance: Patient tolerated treatment well;Patient limited by pain Patient left: in chair;with call bell/phone within reach Nurse Communication: Mobility status;Precautions         Time: 7425-95631530-1545 PT Time Calculation (min): 15 min   Charges:   PT Evaluation $Initial PT Evaluation Tier I: 1 Procedure PT Treatments $Gait Training: 8-22 mins   PT G CodesAlvie Heidelberg:          Folan, Avarose Mervine A 09/20/2014, 3:55 PM Alvie HeidelbergShauna Folan, PT, DPT [phone]

## 2014-09-21 MED ORDER — MORPHINE SULFATE 4 MG/ML IJ SOLN
4.0000 mg | INTRAMUSCULAR | Status: DC | PRN
Start: 2014-09-21 — End: 2014-09-23
  Administered 2014-09-21 – 2014-09-22 (×5): 4 mg via INTRAVENOUS
  Filled 2014-09-21 (×5): qty 1

## 2014-09-21 MED ORDER — GABAPENTIN 300 MG PO CAPS
300.0000 mg | ORAL_CAPSULE | Freq: Two times a day (BID) | ORAL | Status: DC
  Administered 2014-09-21 – 2014-09-23 (×5): 300 mg via ORAL
  Filled 2014-09-21 (×5): qty 1

## 2014-09-21 MED ORDER — POLYETHYLENE GLYCOL 3350 17 G PO PACK
17.0000 g | PACK | Freq: Every day | ORAL | Status: DC
  Administered 2014-09-21 – 2014-09-23 (×3): 17 g via ORAL
  Filled 2014-09-21 (×3): qty 1

## 2014-09-21 MED ORDER — BISACODYL 10 MG RE SUPP
10.0000 mg | Freq: Once | RECTAL | Status: AC
  Administered 2014-09-21: 10 mg via RECTAL
  Filled 2014-09-21: qty 1

## 2014-09-21 NOTE — Progress Notes (Signed)
Physical Therapy Treatment Patient Details Name: Gean QuintLacy J Pulver MRN: [id number] DOB: 11/25/1955 Today's Date: 09/21/2014    History of Present Illness      PT Comments    Pt very focused on abdominal pain and lack of BM since surgery during PT session today. Pt progressing well with mobility.  Follow Up Recommendations  Supervision - Intermittent;No PT follow up     Equipment Recommendations  None recommended by PT    Recommendations for Other Services       Precautions / Restrictions Precautions Precautions: Back Precaution Comments: Pt independently recalled 3/3 back precautions. Required Braces or Orthoses: Spinal Brace Spinal Brace: Lumbar corset;Applied in sitting position    Mobility  Bed Mobility     Rolling: Modified independent (Device/Increase time) Sidelying to sit: Supervision;HOB elevated       General bed mobility comments: supervision for technique/precautions  Transfers   Equipment used: Rolling walker (2 wheeled)   Sit to Stand: Supervision         General transfer comment: Supervision for safety.  Ambulation/Gait Ambulation/Gait assistance: Min guard Ambulation Distance (Feet): 200 Feet Assistive device: Rolling walker (2 wheeled) Gait Pattern/deviations: Step-through pattern;Decreased stride length   Gait velocity interpretation: Below normal speed for age/gender General Gait Details: Pt has been ambulating without A.D. but requested use of RW this session due to pain.   Stairs            Wheelchair Mobility    Modified Rankin (Stroke Patients Only)       Balance   Sitting-balance support: No upper extremity supported;Feet supported Sitting balance-Leahy Scale: Good     Standing balance support: During functional activity Standing balance-Leahy Scale: Fair                      Cognition Arousal/Alertness: Awake/alert Behavior During Therapy: WFL for tasks assessed/performed Overall Cognitive Status: Within  Functional Limits for tasks assessed                      Exercises      General Comments        Pertinent Vitals/Pain Pain Assessment: 0-10 Pain Score: 9  Pain Location: back/abdomen Pain Intervention(s): Monitored during session;Limited activity within patient's tolerance;Repositioned    Home Living                      Prior Function            PT Goals (current goals can now be found in the care plan section) Progress towards PT goals: Progressing toward goals    Frequency  Min 5X/week    PT Plan Current plan remains appropriate    Co-evaluation             End of Session Equipment Utilized During Treatment: Gait belt Activity Tolerance: Patient limited by pain Patient left: in chair;with call bell/phone within reach     Time: [id number] PT Time Calculation (min): 11 min  Charges:  $Gait Training: 8-22 mins                    G Codes:      Ilda FoilGarrow, Alexandro Line Rene 09/21/2014, 10:06 AM

## 2014-09-21 NOTE — Progress Notes (Signed)
Patient ID: [name] Graham J Sedberry, male   DOB: 11/25/1955, 58 y.o.   MRN: [id number] Patient will do okay is having some posterior thigh and calf pain  Strength out of 5 wound clean dry and intact  Mobilized withphysical occupational therapy aggressive bowel regimen

## 2014-09-22 MED ORDER — BISACODYL 10 MG RE SUPP
10.0000 mg | Freq: Every day | RECTAL | Status: DC | PRN
  Administered 2014-09-22: 10 mg via RECTAL
  Filled 2014-09-22: qty 1

## 2014-09-22 MED FILL — Heparin Sodium (Porcine) Inj 1000 Unit/ML: INTRAMUSCULAR | Qty: 30 | Status: AC

## 2014-09-22 MED FILL — Sodium Chloride IV Soln 0.9%: INTRAVENOUS | Qty: 1000 | Status: AC

## 2014-09-22 NOTE — Progress Notes (Signed)
Patient ID: [name] Graham, male   DOB: 09/25/1956, 58 y.o.   MRN: [id number] Ambulating with help. C/o incisional pain  Wound dry. bm positive

## 2014-09-22 NOTE — Progress Notes (Signed)
Physical Therapy Treatment Patient Details Name: [name] Graham MRN: [id number] DOB: 06/12/1956 Today's Date: 09/22/2014    History of Present Illness Patient is a 58 y/o male s/p PLIF L4-5.    PT Comments    Patient continues to make good progress. Patient safe to D/C from a mobility standpoint based on progression towards goals set on PT eval.    Follow Up Recommendations  Supervision - Intermittent;No PT follow up     Equipment Recommendations  None recommended by PT    Recommendations for Other Services       Precautions / Restrictions Precautions Precautions: Back Precaution Comments: Pt independently recalled 3/3 back precautions. Required Braces or Orthoses: Spinal Brace Spinal Brace: Lumbar corset;Applied in sitting position    Mobility  Bed Mobility Overal bed mobility: Modified Independent                Transfers Overall transfer level: Needs assistance Equipment used: Rolling walker (2 wheeled) Transfers: Sit to/from Stand Sit to Stand: Supervision         General transfer comment: Cues for hand placement and not to pull up from RW  Ambulation/Gait Ambulation/Gait assistance: Supervision Ambulation Distance (Feet): 600 Feet Assistive device: Rolling walker (2 wheeled) Gait Pattern/deviations: Step-through pattern;Decreased stride length   Gait velocity interpretation: Below normal speed for age/gender General Gait Details: Used RW for increased ambulation and pain    Stairs Stairs: Yes Stairs assistance: Supervision Stair Management: Alternating pattern;Forwards;Two rails Number of Stairs: 5 General stair comments: Supervision for safety  Wheelchair Mobility    Modified Rankin (Stroke Patients Only)       Balance                                    Cognition Arousal/Alertness: Awake/alert Behavior During Therapy: WFL for tasks assessed/performed Overall Cognitive Status: Within Functional Limits for tasks  assessed                      Exercises      General Comments        Pertinent Vitals/Pain Pain Score: 5  Pain Location: back Pain Descriptors / Indicators: Sore Pain Intervention(s): Monitored during session;Premedicated before session    Home Living                      Prior Function            PT Goals (current goals can now be found in the care plan section) Progress towards PT goals: Progressing toward goals    Frequency  Min 5X/week    PT Plan Current plan remains appropriate    Co-evaluation             End of Session Equipment Utilized During Treatment: Gait belt Activity Tolerance: Patient tolerated treatment well Patient left: in chair;with call bell/phone within reach     Time: 0910-0924 PT Time Calculation (min): 14 min  Charges:  $Gait Training: 8-22 mins                    G Codes:      Fredrich BirksRobinette, Lennis Rader Elizabeth 09/22/2014, 9:42 AM  09/22/2014 Fredrich Birksobinette, Kentley Blyden Elizabeth PTA [phone] pager [phone] office

## 2014-09-23 NOTE — Progress Notes (Signed)
Physical Therapy Treatment Patient Details Name: [name] Graham MRN: [id number] DOB: 1956/01/03 Today's Date: 09/23/2014    History of Present Illness Patient is a 58 y/o male s/p PLIF L4-5.    PT Comments    Patient progressing well and has meet all PT goals. Will sign off on acute PT at this time. Recommend daily ambulation.   Follow Up Recommendations  Supervision - Intermittent;No PT follow up     Equipment Recommendations  None recommended by PT    Recommendations for Other Services       Precautions / Restrictions Precautions Precautions: Back Precaution Comments: Pt independently recalled 3/3 back precautions. Required Braces or Orthoses: Spinal Brace Spinal Brace: Lumbar corset;Applied in sitting position Restrictions Weight Bearing Restrictions: No    Mobility  Bed Mobility                  Transfers Overall transfer level: Modified independent                  Ambulation/Gait Ambulation/Gait assistance: Modified independent (Device/Increase time) Ambulation Distance (Feet): 1200 Feet Assistive device: Rolling walker (2 wheeled) Gait Pattern/deviations: Step-through pattern;Decreased stride length         Stairs   Stairs assistance: Modified independent (Device/Increase time) Stair Management: Two rails Number of Stairs: 5 General stair comments: practiced twice  Wheelchair Mobility    Modified Rankin (Stroke Patients Only)       Balance                                    Cognition Arousal/Alertness: Awake/alert Behavior During Therapy: WFL for tasks assessed/performed Overall Cognitive Status: Within Functional Limits for tasks assessed                      Exercises      General Comments        Pertinent Vitals/Pain Pain Assessment: No/denies pain    Home Living                      Prior Function            PT Goals (current goals can now be found in the care plan section)  Progress towards PT goals: Goals met/education completed, patient discharged from PT    Frequency       PT Plan      Co-evaluation             End of Session Equipment Utilized During Treatment: Back brace Activity Tolerance: Patient tolerated treatment well Patient left: in chair;with call bell/phone within reach     Time: 0903-0916 PT Time Calculation (min): 13 min  Charges:  $Gait Training: 8-22 mins                    G Codes:      Jacqualyn Posey 09/23/2014, 9:51 AM  09/23/2014 Jacqualyn Posey PTA [phone] pager [phone] office

## 2014-09-23 NOTE — Discharge Instructions (Signed)
Spinal Fusion °Care After  °These instructions give you information on caring for yourself after your procedure. Your doctor may also give you more specific instructions. Call your doctor if you have any problems or questions after your procedure. °HOME CARE °· Only take medicines as told by your doctor. °· Do not drive if you are taking certain pain medicines (narcotics). °· Change your bandage (dressing) as told by your doctor. °· Do not get the wound wet. Do not take baths or swim until your doctor says it is okay. °· Check the wound often for redness, puffiness (swelling), or leaking fluids. °· Ask your doctor what activities you should avoid and for how long. °· Walk as much as you can.  Do not sit for long periods of time. Change positions every hour. °· Do not lift anything heavier than 5 to 10 pounds (2.25 to 4.5 kilograms) until your doctor says it is safe. °· Do not twist or bend for 6 weeks. Try not to pull on things. Do not hold things away from your body. °· Ask your doctor what exercises you should do. Exercises can help make the back stronger. °GET HELP RIGHT AWAY IF: °· Your pain suddenly gets worse. °· The wound is red, puffy, bleeding, or leaking fluid. °· Your legs or feet are painful, puffy, weak, or lose feeling (numb). °· You cannot control when you pee (urinate) or poop (bowel movement). °· You have trouble breathing. °· You have chest pain. °· You have a temperature by mouth above 102° F (38.9° C), not controlled by medicine. °MAKE SURE YOU: °· Understand these instructions. °· Will watch your condition. °· Will get help right away if you are not doing well or get worse. °Document Released: 03/03/2011 Document Revised: 01/30/2012 Document Reviewed: 03/03/2011 °[employer]® Patient Information ©2015 [employer]. This information is not intended to replace advice given to you by your health care provider. Make sure you discuss any questions you have with your health care provider. °Spinal  Fusion °Spinal fusion is a procedure to make 2 or more of the bones in your spinal column (vertebrae) grow together (fuse). This procedure stops movement between the vertebrae and can relieve pain and prevent deformity.  °Spinal fusion is used to treat the following conditions: °· Fractures of the spine. °· Herniated disk (the spongy material [cartilage] between the vertebrae). °· Abnormal curvatures of the spine, such as scoliosis or kyphosis. °· A weak or an unstable spine, caused by infections or tumor. °RISKS AND COMPLICATIONS °Complications associated with spinal fusion are rare, but they can occur. Possible complications include: °· Bleeding. °· Infection near the incision. °· Nerve damage. Signs of nerve damage are back pain, pain in one or both legs, weakness, or numbness. °· Spinal fluid leakage. °· Blood clot in your leg, which can move to your lungs. °· Difficulty controlling urination or bowel movements. °BEFORE THE PROCEDURE °· A medical evaluation will be done. This will include a physical exam, blood tests, and imaging exams. °· You will talk with an anesthesiologist. This is the person who will be in charge of the anesthesia during the procedure. Spinal fusion usually requires that you are asleep during the procedure (general anesthesia). °· You will need to stop taking certain medicines, particularly those associated with an increased risk of bleeding. Ask your caregiver about changing or stopping your regular medicines. °· If you smoke, you will need to stop at least 2 weeks before the procedure. Smoking can slow down the healing process,   especially fusion of the vertebrae, and increase the risk of complications. °· Do not eat or drink anything for at least 8 hours before the procedure. °PROCEDURE  °A cut (incision) is made over the vertebrae that will be fused. The back muscles are separated from the vertebrae. If you are having this procedure to treat a herniated disk, the disc material pressing  on the nerve root is removed (decompression). The area where the disk is removed is then filled with extra bone. Bone from another part of your body (autogenous bone) or bone from a bone donor (allograft bone) may be used. The extra bone promotes fusion between the vertebrae. Sometimes, specific medicines are added to the fusion area to promote bone healing. In most cases, screws and rods or metal plates will be used to attach the vertebrae to stabilize them while they fuse.  °AFTER THE PROCEDURE  °· You will stay in a recovery area until the anesthesia has worn off. Your blood pressure and pulse will be checked frequently. °· You will be given antibiotics to prevent infection. °· You may continue to receive fluids through an intravenous (IV) tube while you are still in the hospital. °· Pain after surgery is normal. You will be given pain medicine. °· You will be taught how to move correctly and how to stand and walk. While in bed, you will be instructed to turn frequently, using a "log rolling" technique, in which the entire body is moved without twisting the back. °Document Released: 08/06/2003 Document Revised: 01/30/2012 Document Reviewed: 01/20/2011 °[employer]® Patient Information ©2015 [employer]. This information is not intended to replace advice given to you by your health care provider. Make sure you discuss any questions you have with your health care provider. ° °

## 2014-09-23 NOTE — Progress Notes (Signed)
Discharge orders received, pt for discharge home today, IV D/C with dressing CDI to lower back.  D/C instructions and Rx given with verbalized understanding.  Family at bedside to assist pt with discharge. Staff brought pt downstairs via wheelchair.  

## 2014-09-23 NOTE — Plan of Care (Signed)
Problem: Acute Rehab PT Goals(only PT should resolve) Goal: Pt will Roll Supine to Side Outcome: Completed/Met Date Met:  09/23/14 Goal: Pt Will Go Supine/Side To Sit Outcome: Completed/Met Date Met:  09/23/14 Goal: Pt Will Go Sit To Supine/Side Outcome: Completed/Met Date Met:  09/23/14 Goal: Patient Will Transfer Sit To/From Stand Outcome: Completed/Met Date Met:  09/23/14 Goal: Pt Will Ambulate Outcome: Completed/Met Date Met:  09/23/14 Goal: Pt Will Go Up/Down Stairs Outcome: Completed/Met Date Met:  09/23/14 Goal: Pt Will Verbalize and Adhere to Precautions While PT Will Verbalize and Adhere to Precautions While Performing Mobility  Outcome: Completed/Met Date Met:  09/23/14

## 2014-09-23 NOTE — Discharge Summary (Signed)
Physician Discharge Summary  Patient ID: [name] Graham J Bhullar MRN: [id number] DOB/AGE: 60/11/1955 60 y.o.  Admit date: 09/19/2014 Discharge date: 09/23/2014  Admission Diagnoses:lumbar spondylolisthesis  Discharge Diagnoses:  Active Problems:   Spondylolisthesis of lumbar region   Discharged Condition: ambulating  Hospital Course: surgery  Consults: none  Significant Diagnostic Studies:myelogram  Treatments: lumbar fusion  Discharge Exam: Blood pressure 113/65, pulse 69, temperature 97.8 F (36.6 C), temperature source Oral, resp. rate 19, height 5\' 11"  (1.803 m), weight 78.472 kg (173 lb), SpO2 98 %. No weakness. ambulating  Disposition: home     Medication List    ASK your doctor about these medications        ALPRAZolam 1 MG tablet  Commonly known as:  XANAX  Take 0.5-1 mg by mouth 2 (two) times daily as needed for anxiety or sleep.     buPROPion 150 MG 24 hr tablet  Commonly known as:  WELLBUTRIN XL  Take 150 mg by mouth daily.     naproxen 500 MG tablet  Commonly known as:  NAPROSYN  Take 500 mg by mouth 2 (two) times daily with a meal.     OXYCONTIN 30 MG T12a  Generic drug:  OxyCODONE HCl ER  Take 30 mg by mouth 4 (four) times daily as needed (for pain).     triamcinolone ointment 0.1 %  Commonly known as:  KENALOG  Apply 1 application topically once as needed.         Signed: Karn CassisBOTERO,Curtis Cain M 09/23/2014, 1:36 PM

## 2016-04-25 IMAGING — CT CT L SPINE W/ CM
4 of 12 series · 11 of 33 positions shown, 13 images · non-contrast
Comparison: None

CLINICAL DATA: Back pain

EXAM:
CT MYELOGRAPHY LUMBAR SPINE
TECHNIQUE: CT imaging of the lumbar spine was performed after intrathecal
contrast administration. Multiplanar CT image reconstructions were
also generated.

[Series 2: l spine bone · axial · 0.27mm/px · z∈[-363,-110]mm · 3 of 102 slices shown, 4 images]
[im 1/102  soft-tissue]
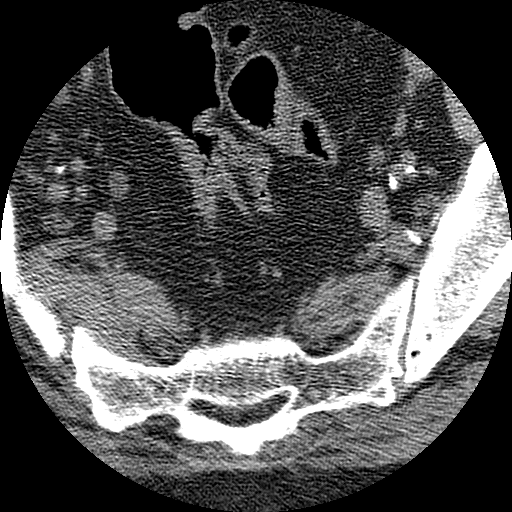
[im 1/102  bone]
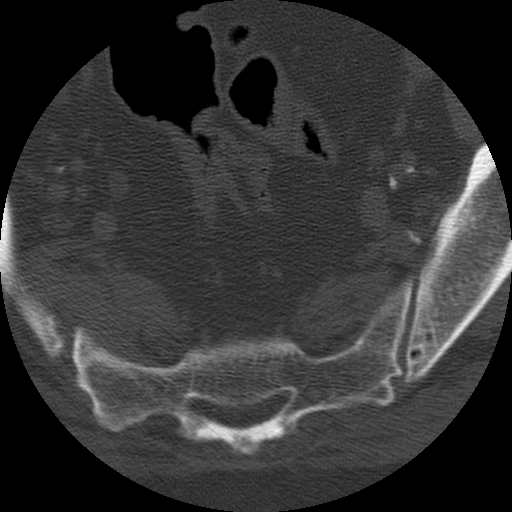
[im 51/102  bone]
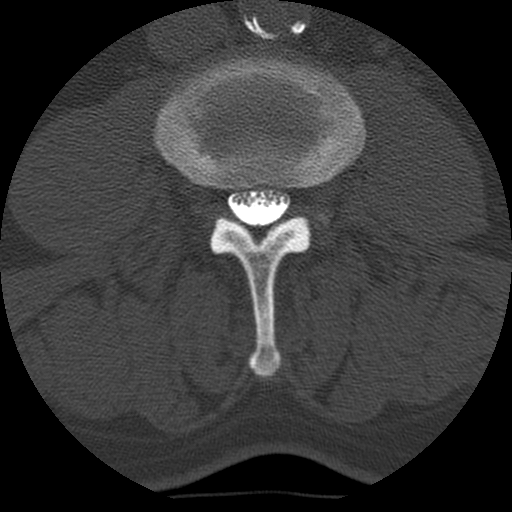
[im 102/102  bone]
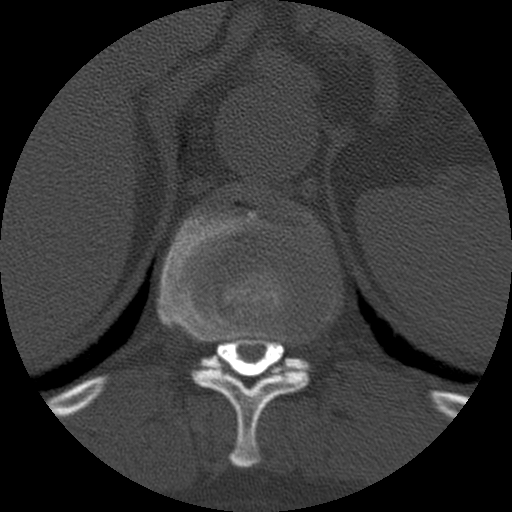

[Series 3: l spine soft · axial · 0.27mm/px · z∈[-280,-195]mm · 2 of 101 slices shown]
[im 34/101  soft-tissue]
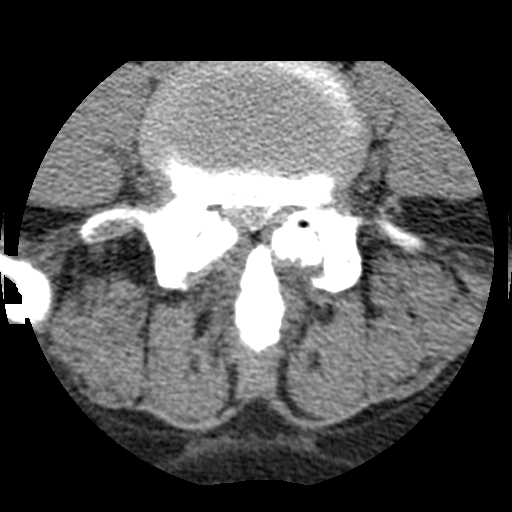
[im 67/101  soft-tissue]
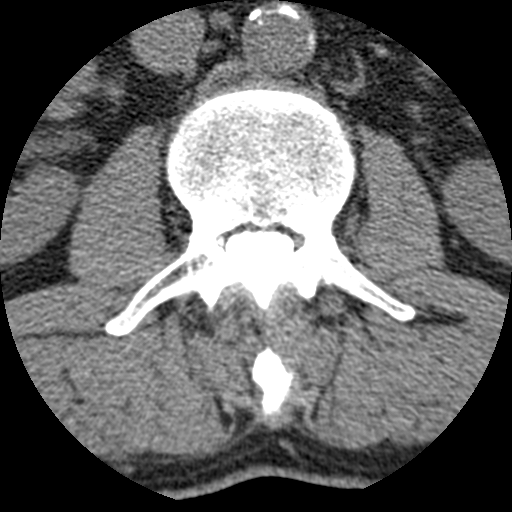

[Series 401: cor · coronal · 0.51mm/px · 1 of 65 slices shown]
[im 50/65  bone]
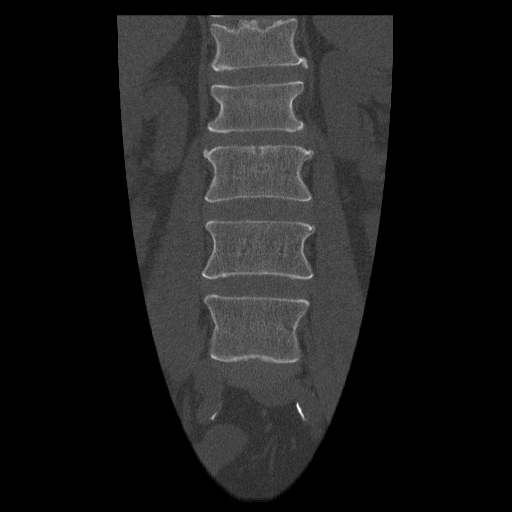

[Series 403: sag · sagittal · 0.51mm/px · 5 of 68 slices shown, 6 images]
[im 23/68  bone]
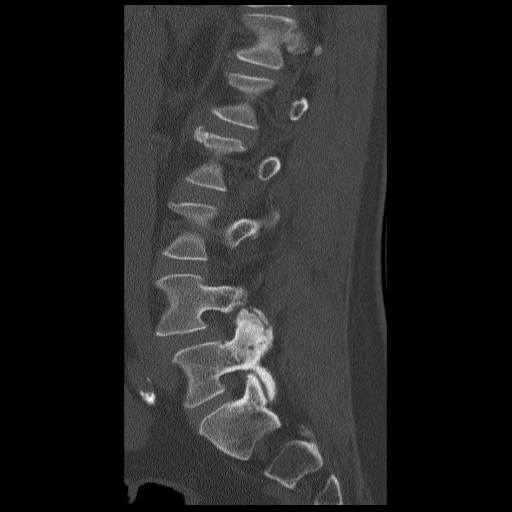
[im 28/68  bone]
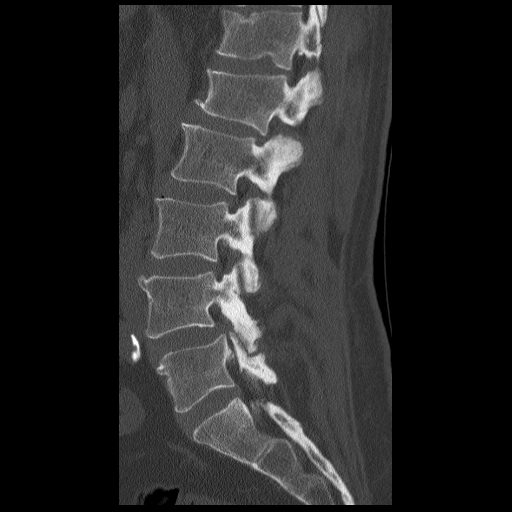
[im 34/68  soft-tissue]
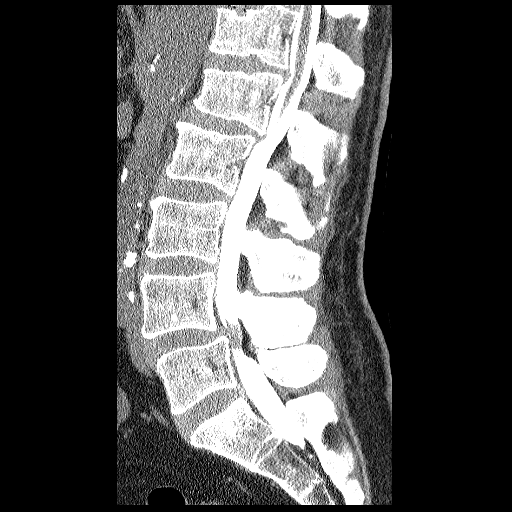
[im 34/68  bone]
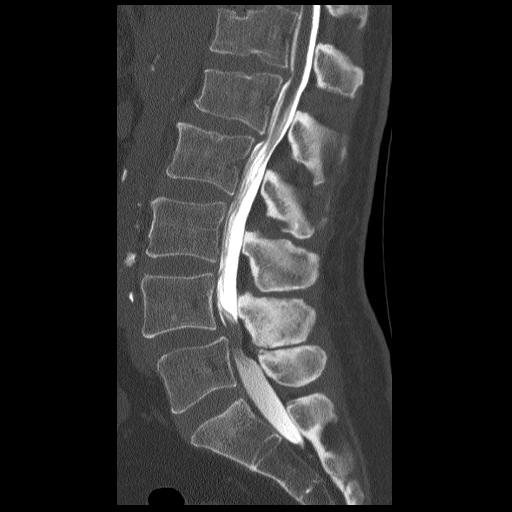
[im 40/68  bone]
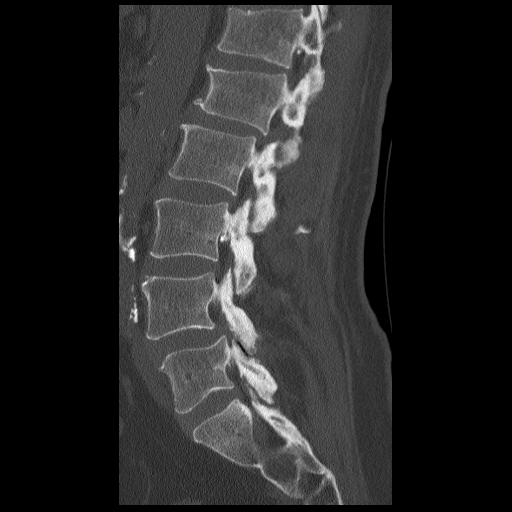
[im 45/68  bone]
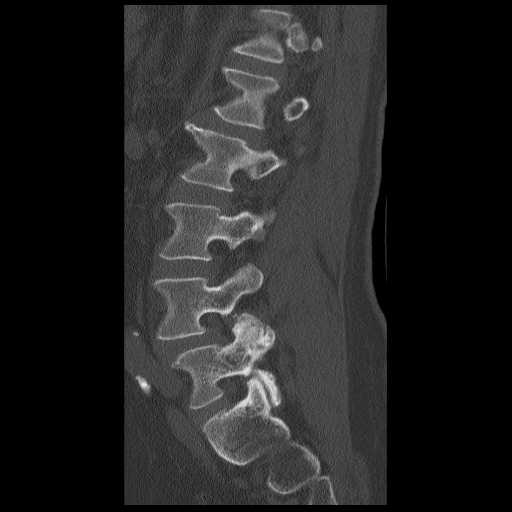

[11 of 33 positions shown; findings below may reference images not displayed]

FINDINGS: Radiologist injection

Conus medullaris terminates at the L1-2 disc. No compression
fracture. 3 mm retrolisthesis L1 upon L2. 3 mm anterolisthesis L4
upon L5.

T12-L1: Central and right paracentral shallow protrusion effaces the
anterior thecal sac without spinal stenosis. This does result in
mild narrowing of left foramen.

L1-2: Moderate concentric disc bulge and mild ligamentum flavum
hypertrophy. There is mild central lateral recess narrowing right
greater than left. Moderate left foraminal narrowing is
multifactorial.

L2-3:  Unremarkable.

L3-4: Unremarkable.

L4-5: Anterolisthesis, short pedicles, ligamentum flavum
hypertrophy, and concentric disc bulge result in severe central
stenosis. Severe facet arthropathy bilaterally. Moderate foraminal
narrowing bilaterally.

L5-S1: Shallow central disc protrusion without neural impingement or
canal compromise.
IMPRESSION: Degenerative disc disease at T12-L1 without central stenosis

Degenerative disc disease at L1-2 with mild central stenosis.

Severe central stenosis at L4-5.

Shallow central protrusion at L5-S1 without compromise.

## 2016-12-20 ENCOUNTER — Other Ambulatory Visit: Payer: Self-pay | Admitting: Neurosurgery

## 2017-03-01 ENCOUNTER — Inpatient Hospital Stay: Admit: 2017-03-01 | Payer: Medicare Other | Admitting: Neurosurgery

## 2017-03-01 SURGERY — POSTERIOR CERVICAL FUSION/FORAMINOTOMY LEVEL 4
Anesthesia: General

## 2022-04-02 ENCOUNTER — Encounter: Payer: Self-pay | Admitting: Cardiology

## 2022-04-02 DIAGNOSIS — I6389 Other cerebral infarction: Secondary | ICD-10-CM

## 2023-11-10 DIAGNOSIS — I10 Essential (primary) hypertension: Secondary | ICD-10-CM

## 2023-11-10 DIAGNOSIS — J810 Acute pulmonary edema: Secondary | ICD-10-CM

## 2023-11-28 ENCOUNTER — Encounter: Payer: Self-pay | Admitting: Internal Medicine
